# Patient Record
Sex: Female | Born: 1985 | Race: White | Hispanic: No | Smoking: Never smoker
Health system: Southern US, Community
[De-identification: ages and names within clinical notes are randomized; demographics above are authoritative.]

## PROBLEM LIST (undated history)

## (undated) DIAGNOSIS — N83209 Unspecified ovarian cyst, unspecified side: Secondary | ICD-10-CM

## (undated) DIAGNOSIS — N200 Calculus of kidney: Secondary | ICD-10-CM

---
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
## 2008-01-14 ENCOUNTER — Ambulatory Visit: Payer: Self-pay | Admitting: Internal Medicine

## 2009-11-30 ENCOUNTER — Emergency Department: Payer: Self-pay | Admitting: Internal Medicine

## 2010-07-13 ENCOUNTER — Emergency Department: Payer: Self-pay | Admitting: Emergency Medicine

## 2010-07-15 ENCOUNTER — Emergency Department (HOSPITAL_COMMUNITY): Payer: Self-pay

## 2010-07-15 ENCOUNTER — Emergency Department (HOSPITAL_COMMUNITY)
Admission: EM | Admit: 2010-07-15 | Discharge: 2010-07-15 | Disposition: A | Discharge status: Home / Self Care | Payer: Self-pay | Attending: Emergency Medicine | Admitting: Emergency Medicine

## 2010-07-15 DIAGNOSIS — N201 Calculus of ureter: Secondary | ICD-10-CM | POA: Insufficient documentation

## 2010-07-15 LAB — URINE MICROSCOPIC-ADD ON

## 2010-07-15 LAB — BASIC METABOLIC PANEL
BUN: 8 mg/dL (ref 6–23)
CO2: 25 mEq/L (ref 19–32)
Calcium: 8.5 mg/dL (ref 8.4–10.5)
Creatinine, Ser: 0.53 mg/dL (ref 0.4–1.2)
GFR calc Af Amer: >60 mL/min (ref >60)

## 2010-07-15 LAB — CBC
MCH: 31.2 pg (ref 26.0–34.0)
MCHC: 34.9 g/dL (ref 30.0–36.0)
Platelets: 164 10*3/uL (ref 150–400)

## 2010-07-15 LAB — URINALYSIS, ROUTINE W REFLEX MICROSCOPIC
Bilirubin Urine: NEGATIVE
Nitrite: NEGATIVE
Specific Gravity, Urine: 1.021 (ref 1.005–1.030)
pH: 6 (ref 5.0–8.0)

## 2010-07-15 LAB — DIFFERENTIAL
Basophils Relative: 0 % (ref 0–1)
Eosinophils Absolute: 0.1 10*3/uL (ref 0.0–0.7)
Eosinophils Relative: 1 % (ref 0–5)
Monocytes Absolute: 0.7 10*3/uL (ref 0.1–1.0)
Monocytes Relative: 10 % (ref 3–12)

## 2010-09-16 ENCOUNTER — Emergency Department: Payer: Self-pay | Admitting: Emergency Medicine

## 2011-02-28 ENCOUNTER — Emergency Department: Payer: Self-pay | Admitting: Emergency Medicine

## 2011-02-28 LAB — URINALYSIS, COMPLETE
Bacteria: NONE SEEN
Glucose,UR: NEGATIVE mg/dL (ref 0–75)
Nitrite: NEGATIVE
Protein: 30
RBC,UR: 59 /HPF (ref 0–5)
WBC UR: 4 /HPF (ref 0–5)

## 2011-02-28 LAB — CBC
MCHC: 33.6 g/dL (ref 32.0–36.0)
Platelet: 218 10*3/uL (ref 150–440)
RDW: 12.6 % (ref 11.5–14.5)
WBC: 13.1 10*3/uL — ABNORMAL HIGH (ref 3.6–11.0)

## 2011-02-28 LAB — BASIC METABOLIC PANEL
BUN: 12 mg/dL (ref 7–18)
EGFR (African American): >60
EGFR (Non-African Amer.): >60
Glucose: 111 mg/dL — ABNORMAL HIGH (ref 65–99)
Potassium: 3.9 mmol/L (ref 3.5–5.1)
Sodium: 144 mmol/L (ref 136–145)

## 2011-02-28 LAB — HCG, QUANTITATIVE, PREGNANCY: Beta Hcg, Quant.: <1 m[IU]/mL — ABNORMAL LOW

## 2012-03-22 ENCOUNTER — Emergency Department: Payer: Self-pay | Admitting: Internal Medicine

## 2012-05-14 ENCOUNTER — Emergency Department: Payer: Self-pay | Admitting: Unknown Physician Specialty

## 2012-05-14 LAB — HCG, QUANTITATIVE, PREGNANCY: Beta Hcg, Quant.: <1 m[IU]/mL — ABNORMAL LOW

## 2012-05-14 LAB — URINALYSIS, COMPLETE
Bacteria: NONE SEEN
Glucose,UR: NEGATIVE mg/dL (ref 0–75)
Nitrite: NEGATIVE
Ph: 5 (ref 4.5–8.0)
Protein: NEGATIVE
RBC,UR: 1 /HPF (ref 0–5)
Specific Gravity: 1.025 (ref 1.003–1.030)
Squamous Epithelial: 1
WBC UR: 2 /HPF (ref 0–5)

## 2012-05-14 LAB — COMPREHENSIVE METABOLIC PANEL
Albumin: 4.3 g/dL (ref 3.4–5.0)
Anion Gap: 6 — ABNORMAL LOW (ref 7–16)
BUN: 11 mg/dL (ref 7–18)
Bilirubin,Total: 0.4 mg/dL (ref 0.2–1.0)
Calcium, Total: 8.9 mg/dL (ref 8.5–10.1)
Co2: 28 mmol/L (ref 21–32)
EGFR (African American): >60
EGFR (Non-African Amer.): >60
Glucose: 88 mg/dL (ref 65–99)
SGOT(AST): 21 U/L (ref 15–37)
Sodium: 143 mmol/L (ref 136–145)

## 2012-05-14 LAB — CBC
MCHC: 34.7 g/dL (ref 32.0–36.0)
Platelet: 210 10*3/uL (ref 150–440)
RBC: 4.77 10*6/uL (ref 3.80–5.20)
RDW: 12.7 % (ref 11.5–14.5)
WBC: 6.2 10*3/uL (ref 3.6–11.0)

## 2012-09-16 ENCOUNTER — Emergency Department: Payer: Self-pay | Admitting: Emergency Medicine

## 2012-09-16 LAB — CBC
MCV: 91 fL (ref 80–100)
RBC: 4.14 10*6/uL (ref 3.80–5.20)

## 2015-09-02 ENCOUNTER — Emergency Department: Payer: BLUE CROSS/BLUE SHIELD

## 2015-09-02 ENCOUNTER — Encounter: Payer: Self-pay | Admitting: Emergency Medicine

## 2015-09-02 ENCOUNTER — Emergency Department
Admission: EM | Admit: 2015-09-02 | Discharge: 2015-09-02 | Disposition: A | Discharge status: Home / Self Care | Payer: BLUE CROSS/BLUE SHIELD | Attending: Emergency Medicine | Admitting: Emergency Medicine

## 2015-09-02 DIAGNOSIS — N200 Calculus of kidney: Secondary | ICD-10-CM | POA: Insufficient documentation

## 2015-09-02 DIAGNOSIS — Z79899 Other long term (current) drug therapy: Secondary | ICD-10-CM | POA: Diagnosis not present

## 2015-09-02 DIAGNOSIS — R102 Pelvic and perineal pain: Secondary | ICD-10-CM

## 2015-09-02 DIAGNOSIS — R109 Unspecified abdominal pain: Secondary | ICD-10-CM

## 2015-09-02 HISTORY — DX: Calculus of kidney: N20.0

## 2015-09-02 HISTORY — DX: Unspecified ovarian cyst, unspecified side: N83.209

## 2015-09-02 LAB — COMPREHENSIVE METABOLIC PANEL
ALBUMIN: 4.2 g/dL (ref 3.5–5.0)
ALK PHOS: 42 U/L (ref 38–126)
ALT: 22 U/L (ref 14–54)
ANION GAP: 5 (ref 5–15)
AST: 22 U/L (ref 15–41)
BUN: 10 mg/dL (ref 6–20)
CALCIUM: 8.8 mg/dL — AB (ref 8.9–10.3)
CHLORIDE: 108 mmol/L (ref 101–111)
CO2: 26 mmol/L (ref 22–32)
Creatinine, Ser: 0.67 mg/dL (ref 0.44–1.00)
GFR calc Af Amer: >60 mL/min (ref >60)
GFR calc non Af Amer: >60 mL/min (ref >60)
GLUCOSE: 111 mg/dL — AB (ref 65–99)
Potassium: 3.8 mmol/L (ref 3.5–5.1)
SODIUM: 139 mmol/L (ref 135–145)
Total Bilirubin: 0.4 mg/dL (ref 0.3–1.2)
Total Protein: 7.1 g/dL (ref 6.5–8.1)

## 2015-09-02 LAB — URINALYSIS COMPLETE WITH MICROSCOPIC (ARMC ONLY)
BACTERIA UA: NONE SEEN
Bilirubin Urine: NEGATIVE
GLUCOSE, UA: NEGATIVE mg/dL
Hgb urine dipstick: NEGATIVE
Ketones, ur: NEGATIVE mg/dL
Leukocytes, UA: NEGATIVE
Nitrite: NEGATIVE
Protein, ur: NEGATIVE mg/dL
SPECIFIC GRAVITY, URINE: 1.014 (ref 1.005–1.030)
pH: 7 (ref 5.0–8.0)

## 2015-09-02 LAB — CBC
HEMATOCRIT: 41.4 % (ref 35.0–47.0)
HEMOGLOBIN: 14.4 g/dL (ref 12.0–16.0)
MCH: 31.7 pg (ref 26.0–34.0)
MCHC: 34.8 g/dL (ref 32.0–36.0)
MCV: 90.9 fL (ref 80.0–100.0)
Platelets: 226 10*3/uL (ref 150–440)
RBC: 4.56 MIL/uL (ref 3.80–5.20)
RDW: 12.8 % (ref 11.5–14.5)
WBC: 7.5 10*3/uL (ref 3.6–11.0)

## 2015-09-02 LAB — POCT PREGNANCY, URINE: PREG TEST UR: NEGATIVE

## 2015-09-02 LAB — LIPASE, BLOOD: LIPASE: 27 U/L (ref 11–51)

## 2015-09-02 MED ORDER — KETOROLAC TROMETHAMINE 30 MG/ML IJ SOLN
30.0000 mg | Freq: Once | INTRAMUSCULAR | Status: AC
Start: 2015-09-02 — End: 2015-09-02
  Administered 2015-09-02: 30 mg via INTRAVENOUS
  Filled 2015-09-02: qty 1

## 2015-09-02 NOTE — ED Notes (Signed)
Pt arrived to the ED for complaints of LLQ sharp pain for about a month getting worse in the last 3 days with new onset of nausea. Pt reports that she has been spotting for the last 2 months and states that she had ovarian cyst on the past. Pt reports starting a new birth control recently. Pt is AOx4 in no apparent distress.

## 2015-09-02 NOTE — ED Provider Notes (Signed)
[facility] Emergency Department Provider Note  ____________________________________________  Time seen: 4:00 AM  I have reviewed the triage vital signs and the nursing notes.   HISTORY  Chief Complaint Abdominal Pain      HPI Judy Kim is a 30 y.o. female with history of ovarian cysts and multiple kidney stones presents with left pelvic pain 3 days currently 7 out of 10. Patient also admits to nausea and vomiting times once tonight. Patient denies any dysuria no hematuria or urinary frequency or urgency. Patient denies any diarrhea or constipation.     Past Medical History  Diagnosis Date  . Ovarian cyst   . Kidney stones     There are no active problems to display for this patient.   Past surgical history None  Current Outpatient Rx  Name  Route  Sig  Dispense  Refill  . etonogestrel-ethinyl estradiol (NUVARING) 0.12-0.015 MG/24HR vaginal ring   Vaginal   Place 1 each vaginally every 28 (twenty-eight) days. Insert vaginally and leave in place for 3 consecutive weeks, then remove for 1 week.           Allergies Keflex  History reviewed. No pertinent family history.  Social History Social History  Substance Use Topics  . Smoking status: Never Smoker   . Smokeless tobacco: None  . Alcohol Use: Yes    Review of Systems  Constitutional: Negative for fever. Eyes: Negative for visual changes. ENT: Negative for sore throat. Cardiovascular: Negative for chest pain. Respiratory: Negative for shortness of breath. Gastrointestinal: Negative for abdominal pain, vomiting and diarrhea. Genitourinary: Negative for dysuria.Positive for pelvic pain Musculoskeletal: Negative for back pain. Skin: Negative for rash. Neurological: Negative for headaches, focal weakness or numbness.   10-point ROS otherwise negative.  ____________________________________________   PHYSICAL EXAM:  VITAL SIGNS: ED Triage Vitals  Enc Vitals Group    BP 09/02/15 0339 128/78 mmHg     Pulse Rate 09/02/15 0300 92     Resp 09/02/15 0300 18     Temp 09/02/15 0300 98.4 F (36.9 C)     Temp Source 09/02/15 0300 Oral     SpO2 09/02/15 0300 100 %     Weight 09/02/15 0300 140 lb (63.504 kg)     Height 09/02/15 0300 4\' 11"  (1.499 m)     Head Cir --      Peak Flow --      Pain Score 09/02/15 0301 8     Pain Loc --      Pain Edu? --      Excl. in GC? --     Constitutional: Alert and oriented. Apparent discomfort. Eyes: Conjunctivae are normal. PERRL. Normal extraocular movements. ENT   Head: Normocephalic and atraumatic.   Nose: No congestion/rhinnorhea.   Mouth/Throat: Mucous membranes are moist.   Neck: No stridor. Hematological/Lymphatic/Immunilogical: No cervical lymphadenopathy. Cardiovascular: Normal rate, regular rhythm. Normal and symmetric distal pulses are present in all extremities. No murmurs, rubs, or gallops. Respiratory: Normal respiratory effort without tachypnea nor retractions. Breath sounds are clear and equal bilaterally. No wheezes/rales/rhonchi. Gastrointestinal: Soft and nontender. No distention. There is no CVA tenderness. Genitourinary: deferred Musculoskeletal: Nontender with normal range of motion in all extremities. No joint effusions.  No lower extremity tenderness nor edema. Neurologic:  Normal speech and language. No gross focal neurologic deficits are appreciated. Speech is normal.  Skin:  Skin is warm, dry and intact. No rash noted. Psychiatric: Mood and affect are normal. Speech and behavior are normal. Patient exhibits  appropriate insight and judgment.  ____________________________________________    LABS (pertinent positives/negatives)  Labs Reviewed  COMPREHENSIVE METABOLIC PANEL - Abnormal; Notable for the following:    Glucose, Bld 111 (*)    Calcium 8.8 (*)    All other components within normal limits  URINALYSIS COMPLETEWITH MICROSCOPIC ([facility] ONLY) - Abnormal; Notable for the  following:    Color, Urine YELLOW (*)    APPearance CLEAR (*)    Squamous Epithelial / LPF 0-5 (*)    All other components within normal limits  LIPASE, BLOOD  CBC  POCT PREGNANCY, URINE         RADIOLOGY  CT Renal Stone Study (Final result) Result time: 09/02/15 06:37:09   Final result by Rad Results In Interface (09/02/15 06:37:09)   Narrative:   CLINICAL DATA: Left pelvic pain  EXAM: CT ABDOMEN AND PELVIS WITHOUT CONTRAST  TECHNIQUE: Multidetector CT imaging of the abdomen and pelvis was performed following the standard protocol without IV contrast.  COMPARISON: 02/28/2011  FINDINGS: Lower chest and abdominal wall: No contributory findings.  Hepatobiliary: No focal liver abnormality.No evidence of biliary obstruction or stone.  Pancreas: Unremarkable.  Spleen: Unremarkable.  Adrenals/Urinary Tract: Negative adrenals. Two presumed cysts in the interpolar left kidney both measuring 14 mm. 4 mm lower pole stone on the right. Punctate stone on the left. No hydronephrosis or ureteral calculus. Unremarkable bladder.  Stomach/Bowel: No obstruction or inflammation. No appendicitis.  Reproductive:No pathologic findings. Vaginal ring in place.  Vascular/Lymphatic: No acute vascular abnormality. No mass or adenopathy.  Other: Trace pelvic fluid, usually physiologic.  Musculoskeletal: No acute abnormalities.  IMPRESSION: 1. No acute finding to explain abdominal pain. 2. Bilateral nonobstructive renal calculi.   Electronically Signed By: Marnee Spring M.D. On: 09/02/2015 06:37          US Pelvis Complete (Final result) Result time: 09/02/15 05:00:48   Final result by Rad Results In Interface (09/02/15 05:00:48)   Narrative:   CLINICAL DATA: Pelvic pain for 3 days  EXAM: TRANSABDOMINAL AND TRANSVAGINAL ULTRASOUND OF PELVIS  TECHNIQUE: Both transabdominal and transvaginal ultrasound examinations of the pelvis were performed.  Transabdominal technique was performed for global imaging of the pelvis including uterus, ovaries, adnexal regions, and pelvic cul-de-sac. It was necessary to proceed with endovaginal exam following the transabdominal exam to visualize the endometrium and ovaries.  COMPARISON: 05/14/2012  FINDINGS: Uterus  Measurements: 6 x 3 x 5 cm. No fibroids or other mass visualized.  Endometrium  Thickness: 9 mm. No focal abnormality visualized.  Right ovary  Measurements: 28 x 21 x 29 mm. Normal appearance/no adnexal mass.  Left ovary  Measurements: 23 x 18 x 29 mm. Normal appearance/no adnexal mass.  Other findings  No abnormal free fluid.  IMPRESSION: Normal pelvic ultrasound.   Electronically Signed By: Marnee Spring M.D. On: 09/02/2015 05:00          US Transvaginal Non-OB (Final result) Result time: 09/02/15 05:00:48   Final result by Rad Results In Interface (09/02/15 05:00:48)   Narrative:   CLINICAL DATA: Pelvic pain for 3 days  EXAM: TRANSABDOMINAL AND TRANSVAGINAL ULTRASOUND OF PELVIS  TECHNIQUE: Both transabdominal and transvaginal ultrasound examinations of the pelvis were performed. Transabdominal technique was performed for global imaging of the pelvis including uterus, ovaries, adnexal regions, and pelvic cul-de-sac. It was necessary to proceed with endovaginal exam following the transabdominal exam to visualize the endometrium and ovaries.  COMPARISON: 05/14/2012  FINDINGS: Uterus  Measurements: 6 x 3 x 5 cm. No fibroids or other mass  visualized.  Endometrium  Thickness: 9 mm. No focal abnormality visualized.  Right ovary  Measurements: 28 x 21 x 29 mm. Normal appearance/no adnexal mass.  Left ovary  Measurements: 23 x 18 x 29 mm. Normal appearance/no adnexal mass.  Other findings  No abnormal free fluid.  IMPRESSION: Normal pelvic ultrasound.   Electronically Signed By: Marnee SpringJonathon Watts M.D. On: 09/02/2015  05:00      Procedures     INITIAL IMPRESSION / ASSESSMENT AND PLAN / ED COURSE  Pertinent labs & imaging results that were available during my care of the patient were reviewed by me and considered in my medical decision making (see chart for details).  Patient given Toradol 30 mg IV  ____________________________________________   FINAL CLINICAL IMPRESSION(S) / ED DIAGNOSES  Final diagnoses:  Abdominal pain, unspecified abdominal location  Kidney stones      Darci Currentandolph N Airelle Everding, MD 09/03/15 [phone]

## 2015-09-02 NOTE — ED Notes (Signed)
Patient transported to Ultrasound 

## 2015-09-02 NOTE — Discharge Instructions (Signed)
Abdominal Pain, Adult °Many things can cause abdominal pain. Usually, abdominal pain is not caused by a disease and will improve without treatment. It can often be observed and treated at home. Your health care provider will do a physical exam and possibly order blood tests and X-rays to help determine the seriousness of your pain. However, in many cases, more time must pass before a clear cause of the pain can be found. Before that point, your health care provider may not know if you need more testing or further treatment. °HOME CARE INSTRUCTIONS °Monitor your abdominal pain for any changes. The following actions may help to alleviate any discomfort you are experiencing: °· Only take over-the-counter or prescription medicines as directed by your health care provider. °· Do not take laxatives unless directed to do so by your health care provider. °· Try a clear liquid diet (broth, tea, or water) as directed by your health care provider. Slowly move to a bland diet as tolerated. °SEEK MEDICAL CARE IF: °· You have unexplained abdominal pain. °· You have abdominal pain associated with nausea or diarrhea. °· You have pain when you urinate or have a bowel movement. °· You experience abdominal pain that wakes you in the night. °· You have abdominal pain that is worsened or improved by eating food. °· You have abdominal pain that is worsened with eating fatty foods. °· You have a fever. °SEEK IMMEDIATE MEDICAL CARE IF: °· Your pain does not go away within 2 hours. °· You keep throwing up (vomiting). °· Your pain is felt only in portions of the abdomen, such as the right side or the left lower portion of the abdomen. °· You pass bloody or black tarry stools. °MAKE SURE YOU: °· Understand these instructions. °· Will watch your condition. °· Will get help right away if you are not doing well or get worse. °  °This information is not intended to replace advice given to you by your health care provider. Make sure you discuss  any questions you have with your health care provider. °  °Document Released: 11/10/2004 Document Revised: 10/22/2014 Document Reviewed: 10/10/2012 °Elsevier Interactive Patient Education ©2016 [employer] ° °Kidney Stones °Kidney stones (urolithiasis) are deposits that form inside your kidneys. The intense pain is caused by the stone moving through the urinary tract. When the stone moves, the ureter goes into spasm around the stone. The stone is usually passed in the urine.  °CAUSES  °· A disorder that makes certain neck glands produce too much parathyroid hormone (primary hyperparathyroidism). °· A buildup of uric acid crystals, similar to gout in your joints. °· Narrowing (stricture) of the ureter. °· A kidney obstruction present at birth (congenital obstruction). °· Previous surgery on the kidney or ureters. °· Numerous kidney infections. °SYMPTOMS  °· Feeling sick to your stomach (nauseous). °· Throwing up (vomiting). °· Blood in the urine (hematuria). °· Pain that usually spreads (radiates) to the groin. °· Frequency or urgency of urination. °DIAGNOSIS  °· Taking a history and physical exam. °· Blood or urine tests. °· CT scan. °· Occasionally, an examination of the inside of the urinary bladder (cystoscopy) is performed. °TREATMENT  °· Observation. °· Increasing your fluid intake. °· Extracorporeal shock wave lithotripsy--This is a noninvasive procedure that uses shock waves to break up kidney stones. °· Surgery may be needed if you have severe pain or persistent obstruction. There are various surgical procedures. Most of the procedures are performed with the use of small instruments. Only small incisions   are needed to accommodate these instruments, so recovery time is minimized. °The size, location, and chemical composition are all important variables that will determine the proper choice of action for you. Talk to your health care provider to better understand your situation so that you will minimize the  risk of injury to yourself and your kidney.  °HOME CARE INSTRUCTIONS  °· Drink enough water and fluids to keep your urine clear or pale yellow. This will help you to pass the stone or stone fragments. °· Strain all urine through the provided strainer. Keep all particulate matter and stones for your health care provider to see. The stone causing the pain may be as small as a grain of salt. It is very important to use the strainer each and every time you pass your urine. The collection of your stone will allow your health care provider to analyze it and verify that a stone has actually passed. The stone analysis will often identify what you can do to reduce the incidence of recurrences. °· Only take over-the-counter or prescription medicines for pain, discomfort, or fever as directed by your health care provider. °· Keep all follow-up visits as told by your health care provider. This is important. °· Get follow-up X-rays if required. The absence of pain does not always mean that the stone has passed. It may have only stopped moving. If the urine remains completely obstructed, it can cause loss of kidney function or even complete destruction of the kidney. It is your responsibility to make sure X-rays and follow-ups are completed. Ultrasounds of the kidney can show blockages and the status of the kidney. Ultrasounds are not associated with any radiation and can be performed easily in a matter of minutes. °· Make changes to your daily diet as told by your health care provider. You may be told to: °¨ Limit the amount of salt that you eat. °¨ Eat 5 or more servings of fruits and vegetables each day. °¨ Limit the amount of meat, poultry, fish, and eggs that you eat. °· Collect a 24-hour urine sample as told by your health care provider. You may need to collect another urine sample every 6-12 months. °SEEK MEDICAL CARE IF: °· You experience pain that is progressive and unresponsive to any pain medicine you have been  prescribed. °SEEK IMMEDIATE MEDICAL CARE IF:  °· Pain cannot be controlled with the prescribed medicine. °· You have a fever or shaking chills. °· The severity or intensity of pain increases over 18 hours and is not relieved by pain medicine. °· You develop a new onset of abdominal pain. °· You feel faint or pass out. °· You are unable to urinate. °  °This information is not intended to replace advice given to you by your health care provider. Make sure you discuss any questions you have with your health care provider. °  °Document Released: 01/31/2005 Document Revised: 10/22/2014 Document Reviewed: 07/04/2012 °Elsevier Interactive Patient Education ©2016 Elsevier Inc. ° °

## 2015-09-02 NOTE — ED Notes (Addendum)
Pt reports that she is having sharp pains in left lower side for the past three days - Today the pain is constant so she came to the er - Pt states she has only voided once today but is not having pain - Pt reports she has been vomiting today ( x7 - 8 times today) - Pt denies diarrhea

## 2015-10-23 ENCOUNTER — Ambulatory Visit
Admission: EM | Admit: 2015-10-23 | Discharge: 2015-10-23 | Disposition: A | Discharge status: Home / Self Care | Payer: BLUE CROSS/BLUE SHIELD | Attending: Family Medicine | Admitting: Family Medicine

## 2015-10-23 DIAGNOSIS — J029 Acute pharyngitis, unspecified: Secondary | ICD-10-CM | POA: Diagnosis not present

## 2015-10-23 DIAGNOSIS — J069 Acute upper respiratory infection, unspecified: Secondary | ICD-10-CM | POA: Diagnosis not present

## 2015-10-23 LAB — RAPID STREP SCREEN (MED CTR MEBANE ONLY): Streptococcus, Group A Screen (Direct): NEGATIVE

## 2015-10-23 MED ORDER — HYDROCOD POLST-CPM POLST ER 10-8 MG/5ML PO SUER: 5.0000 mL | Freq: Two times a day (BID) | ORAL | 0 refills | Status: AC

## 2015-10-23 MED ORDER — BENZONATATE 200 MG PO CAPS: 200.0000 mg | ORAL_CAPSULE | Freq: Three times a day (TID) | ORAL | 0 refills | Status: AC

## 2015-10-23 NOTE — ED Provider Notes (Signed)
CSN: [id number]     Arrival date & time 10/23/15  1304 History   First MD Initiated Contact with Patient 10/23/15 1433     Chief Complaint  Patient presents with  . Sore Throat   (Consider location/radiation/quality/duration/timing/severity/associated sxs/prior Treatment) HPI  This a 30 year old female who presents with a three-day history of sore throat particularly when awakening chills fever to 100 by aches and a nagging cough that is particularly worse at nighttime. Taking over-the-counter preparations that have been helpful for short periods of time. The cough is nonproductive. Is a nonsmoker. Patient states that she needs a note for work in order to return all her she states she has not missed any work at all. She is afebrile today.     Past Medical History:  Diagnosis Date  . Kidney stones   . Ovarian cyst    History reviewed. No pertinent surgical history. History reviewed. No pertinent family history. Social History  Substance Use Topics  . Smoking status: Never Smoker  . Smokeless tobacco: Never Used  . Alcohol use Yes     Comment: social   OB History    Gravida Para Term Preterm AB Living   3 0 0 0 3 0   SAB TAB Ectopic Multiple Live Births   3 0 0 0       Review of Systems  Constitutional: Positive for chills and fever. Negative for activity change and fatigue.  HENT: Positive for congestion, postnasal drip, rhinorrhea, sinus pressure and sore throat.   Respiratory: Positive for cough.   All other systems reviewed and are negative.   Allergies  Keflex [cephalexin]  Home Medications   Prior to Admission medications   Medication Sig Start Date End Date Taking? Authorizing Provider  etonogestrel-ethinyl estradiol (NUVARING) 0.12-0.015 MG/24HR vaginal ring Place 1 each vaginally every 28 (twenty-eight) days. Insert vaginally and leave in place for 3 consecutive weeks, then remove for 1 week.   Yes Historical Provider, MD  benzonatate (TESSALON) 200 MG capsule  Take 1 capsule (200 mg total) by mouth every 8 (eight) hours. 10/23/15   Lutricia FeilWilliam P Aleksi Brummet, PA-C  chlorpheniramine-HYDROcodone (TUSSIONEX PENNKINETIC ER) 10-8 MG/5ML SUER Take 5 mLs by mouth 2 (two) times daily. 10/23/15   Lutricia FeilWilliam P Kemyra August, PA-C   Meds Ordered and Administered this Visit  Medications - No data to display  BP 127/73 (BP Location: Right Arm)   Pulse 76   Temp 98.2 F (36.8 C) (Oral)   Resp 16   Ht 4\' 11"  (1.499 m)   Wt 134 lb (60.8 kg)   LMP 10/07/2015   SpO2 100%   BMI 27.06 kg/m  No data found.   Physical Exam  Constitutional: She is oriented to person, place, and time. She appears well-developed and well-nourished. No distress.  HENT:  Head: Normocephalic and atraumatic.  Right Ear: External ear normal.  Left Ear: External ear normal.  Nose: Nose normal.  Mouth/Throat: Oropharynx is clear and moist. No oropharyngeal exudate.  Eyes: EOM are normal. Pupils are equal, round, and reactive to light. Right eye exhibits no discharge. Left eye exhibits no discharge.  Neck: Normal range of motion. Neck supple.  Pulmonary/Chest: Effort normal and breath sounds normal. No respiratory distress. She has no wheezes. She has no rales.  Musculoskeletal: Normal range of motion.  Lymphadenopathy:    She has no cervical adenopathy.  Neurological: She is alert and oriented to person, place, and time.  Skin: Skin is warm and dry. She is not diaphoretic.  Psychiatric: She has a normal mood and affect. Her behavior is normal. Judgment and thought content normal.  Nursing note and vitals reviewed.   Urgent Care Course   Clinical Course    Procedures (including critical care time)  Labs Review Labs Reviewed  RAPID STREP SCREEN (NOT AT [facility])  CULTURE, GROUP A STREP [facility])    Imaging Review No results found.   Visual Acuity Review  Right Eye Distance:   Left Eye Distance:   Bilateral Distance:    Right Eye Near:   Left Eye Near:    Bilateral Near:         MDM    1. Viral pharyngitis   2. URI (upper respiratory infection)    New Prescriptions   BENZONATATE (TESSALON) 200 MG CAPSULE    Take 1 capsule (200 mg total) by mouth every 8 (eight) hours.   CHLORPHENIRAMINE-HYDROCODONE (TUSSIONEX PENNKINETIC ER) 10-8 MG/5ML SUER    Take 5 mLs by mouth 2 (two) times daily.  Plan: 1. Test/x-ray results and diagnosis reviewed with patient 2. rx as per orders; risks, benefits, potential side effects reviewed with patient 3. Recommend supportive treatment with Fluids and rest as necessary. Salt water gargles may be comforting a beneficial. A note was written to have her return to work on Sunday. 4. F/u prn if symptoms worsen or don't improve     Lutricia Feil, PA-C 10/23/15 1502

## 2015-10-23 NOTE — ED Triage Notes (Signed)
Pt needs work note for her illness. Pt with 2 day hx of low grade fever, sore throat, achy. Pain 8/10

## 2015-10-26 LAB — CULTURE, GROUP A STREP (THRC)

## 2015-10-27 ENCOUNTER — Telehealth (HOSPITAL_COMMUNITY): Payer: Self-pay | Admitting: Internal Medicine

## 2015-10-27 MED ORDER — AZITHROMYCIN 250 MG PO TABS: 250.0000 mg | ORAL_TABLET | Freq: Every day | ORAL | 0 refills | Status: AC

## 2015-10-27 NOTE — Telephone Encounter (Signed)
Clinical staff, please let patient know that throat cx was positive for an atypical strep.  Rx zithromax xent to the pharmacy of record,  [facility].  Recheck or followup with PCP/[facility] for further evaluation if symptoms persist.  LM

## 2016-11-28 IMAGING — US US PELVIS COMPLETE
1 series · 14 of 25 positions shown · non-contrast
Comparison: 05/14/2012

CLINICAL DATA: Pelvic pain for 3 days

EXAM:
TRANSABDOMINAL AND TRANSVAGINAL ULTRASOUND OF PELVIS
TECHNIQUE: Both transabdominal and transvaginal ultrasound examinations of the
pelvis were performed. Transabdominal technique was performed for
global imaging of the pelvis including uterus, ovaries, adnexal
regions, and pelvic cul-de-sac. It was necessary to proceed with
endovaginal exam following the transabdominal exam to visualize the
endometrium and ovaries.

[Series 1: us pelvis complete · 0.23mm/px · 14 of 89 slices shown]
[im 1/89]
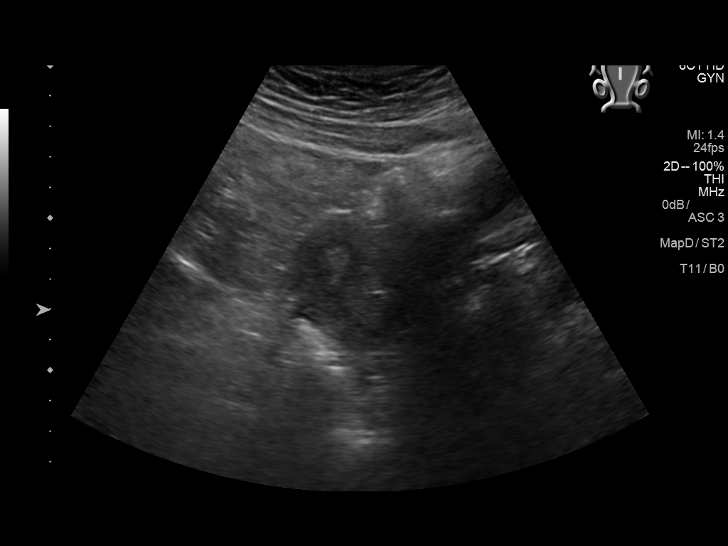
[im 8/89]
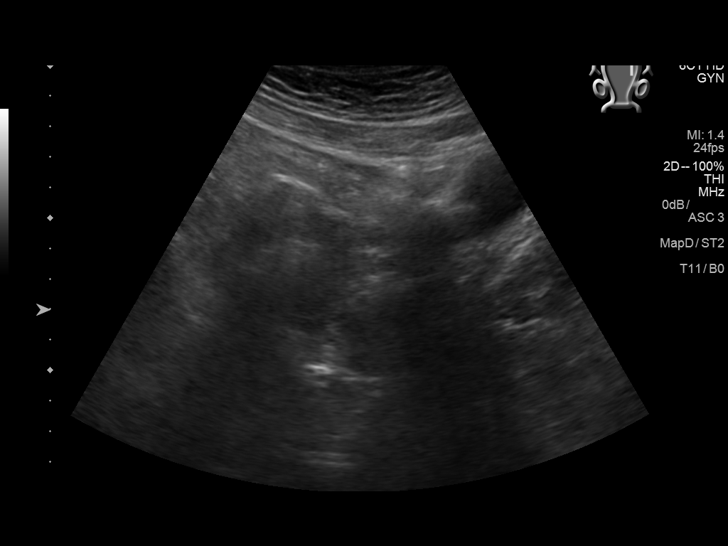
[im 15/89]
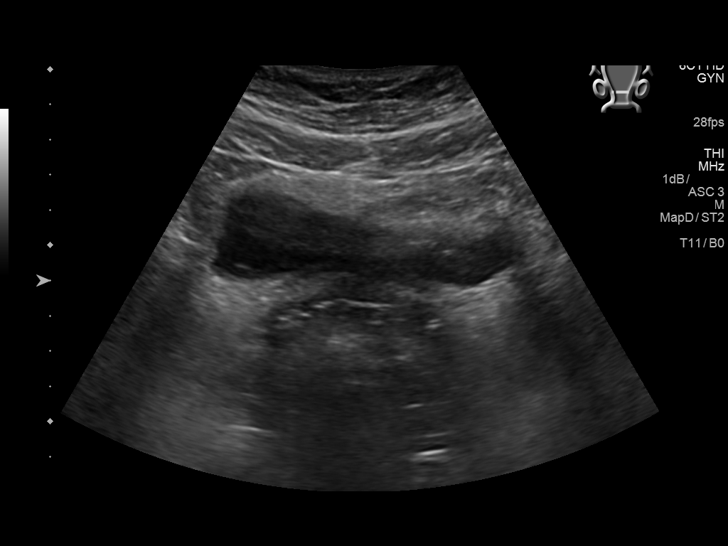
[im 23/89]
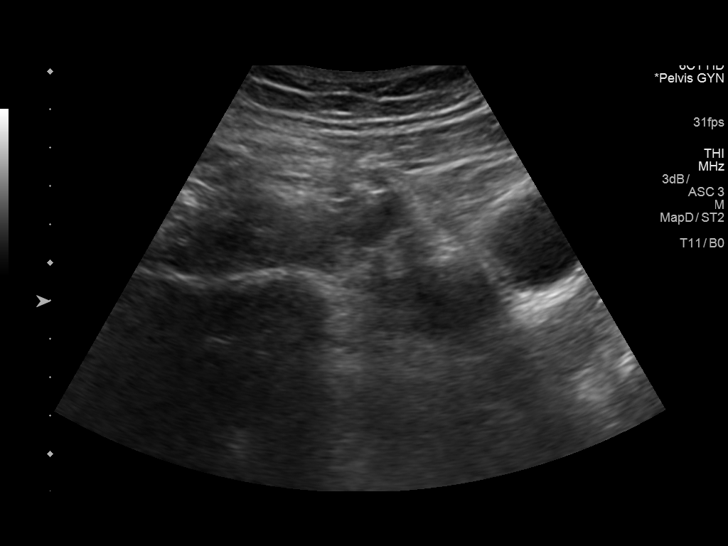
[im 30/89]
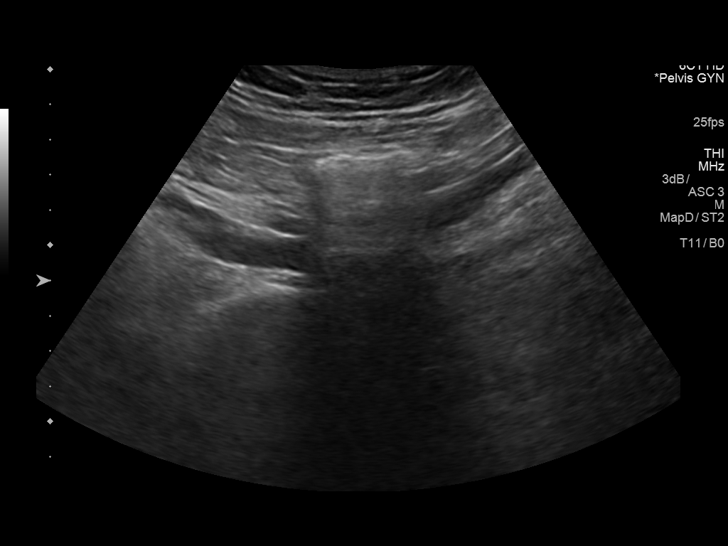
[im 34/89]
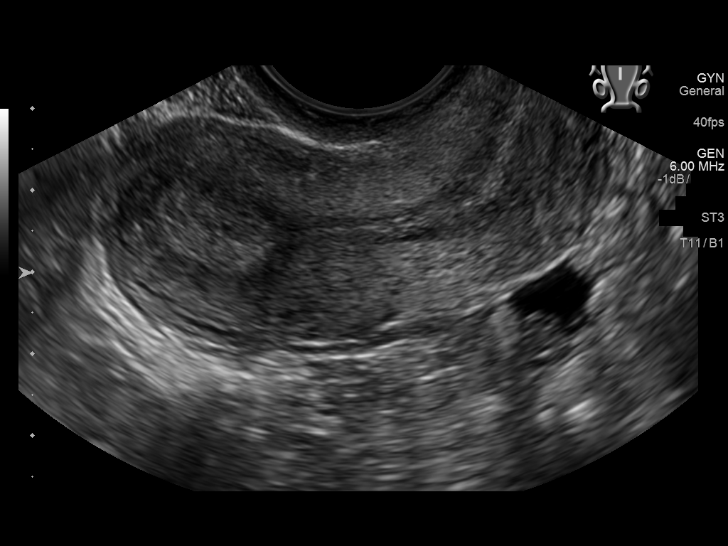
[im 41/89]
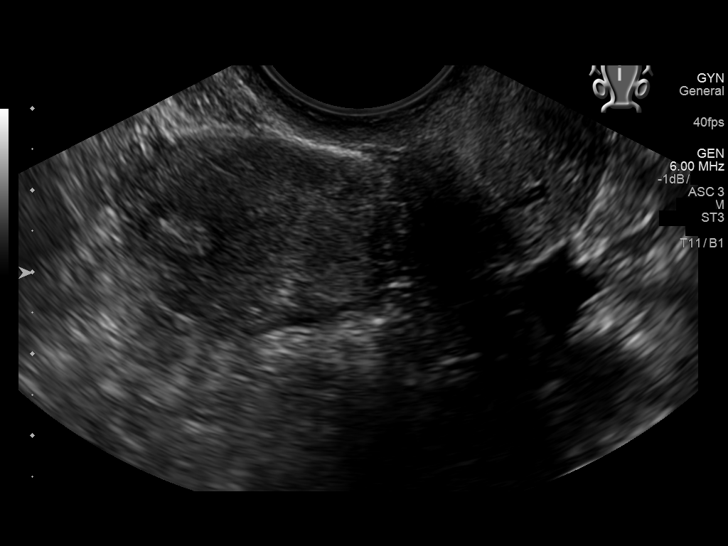
[im 48/89]
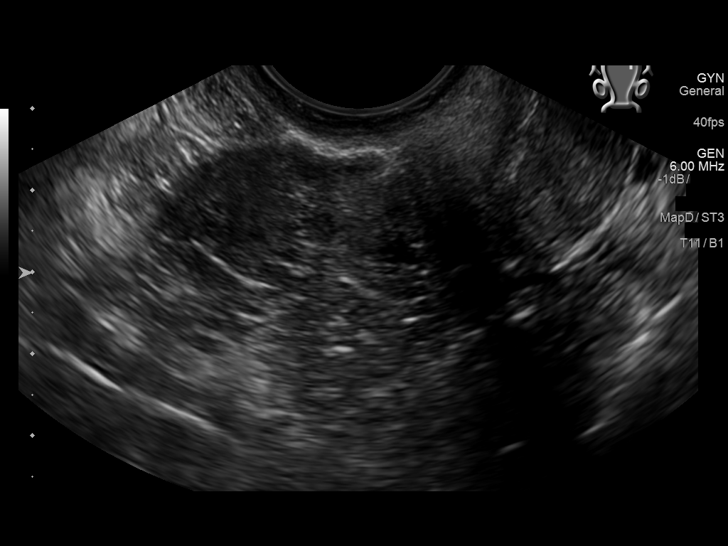
[im 56/89]
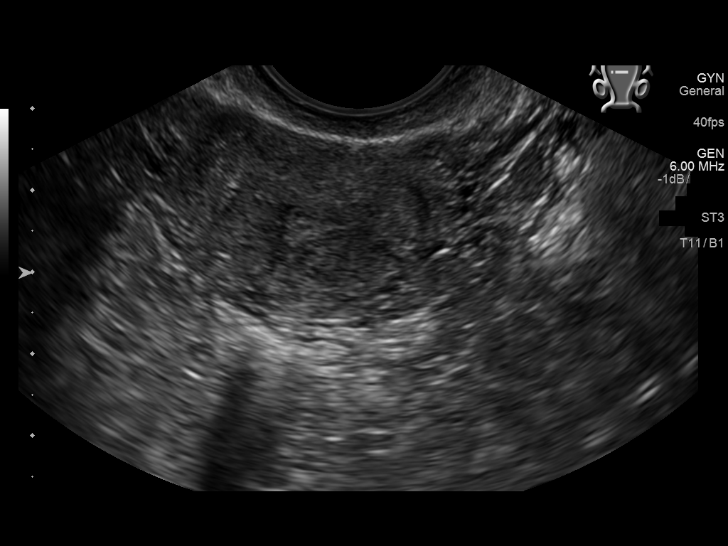
[im 59/89]
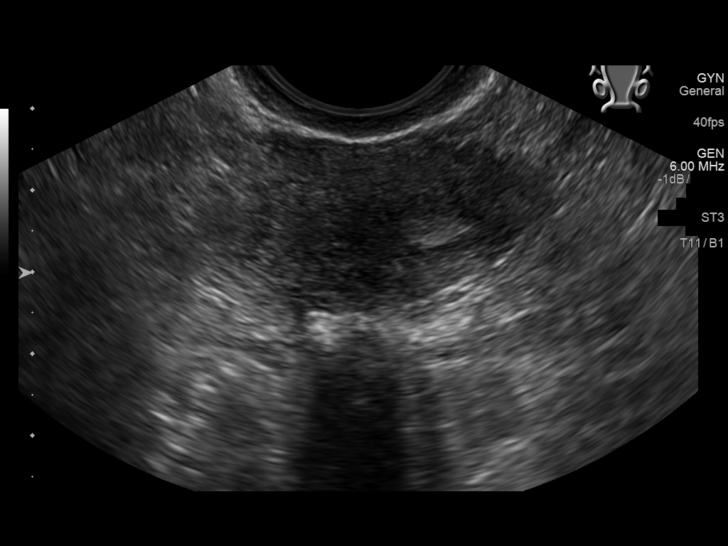
[im 67/89]
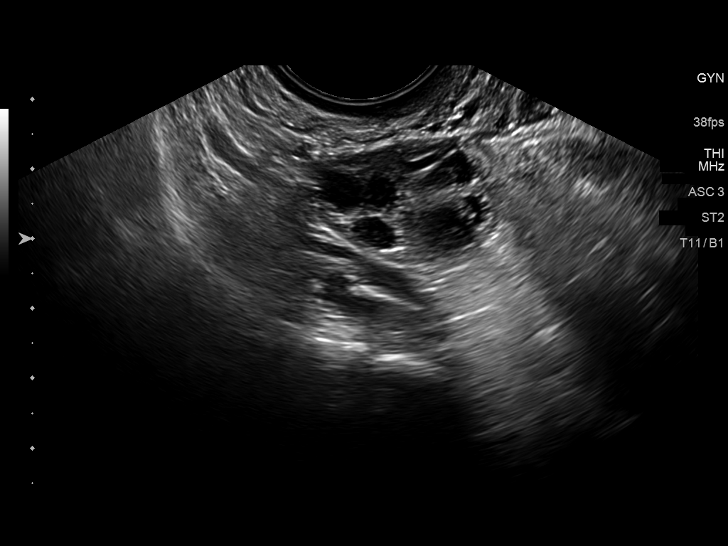
[im 74/89]
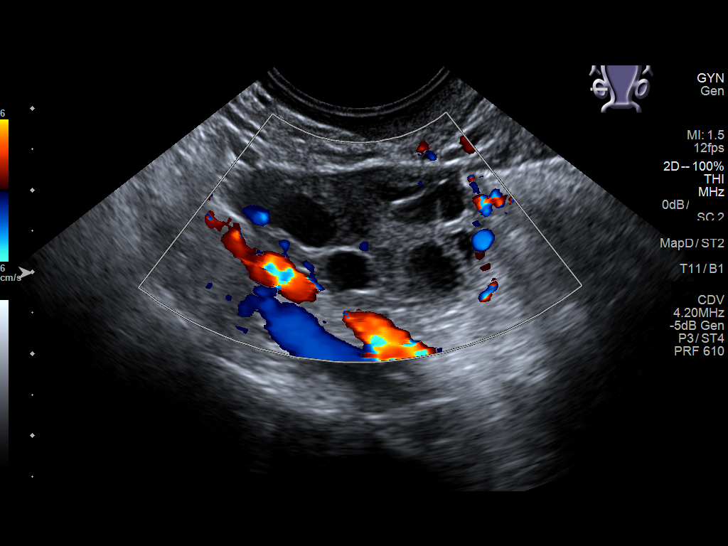
[im 81/89]
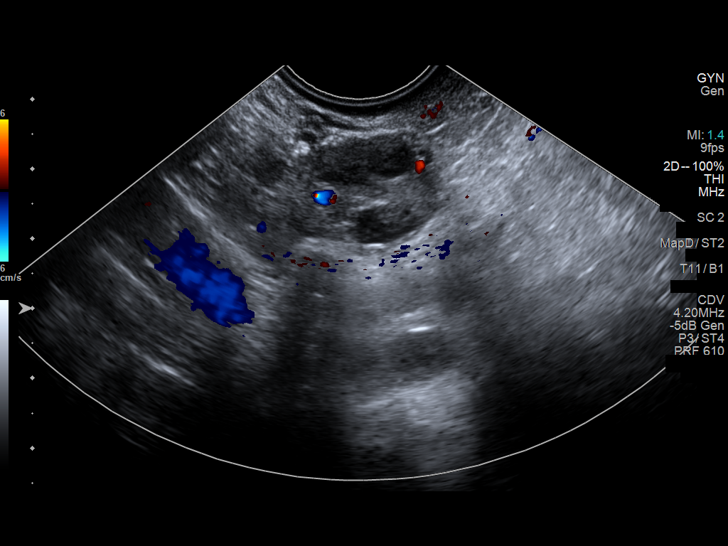
[im 89/89]
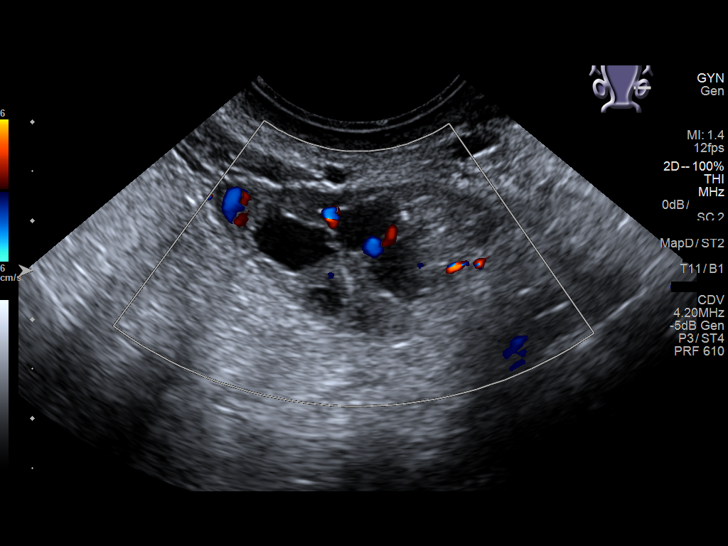

[14 of 25 positions shown; findings below may reference images not displayed]

FINDINGS: Uterus

Measurements: 6 x 3 x 5 cm. No fibroids or other mass visualized.

Endometrium

Thickness: 9 mm.  No focal abnormality visualized.

Right ovary

Measurements: 28 x 21 x 29 mm. Normal appearance/no adnexal mass.

Left ovary

Measurements: 23 x 18 x 29 mm. Normal appearance/no adnexal mass.

Other findings

No abnormal free fluid.
IMPRESSION: Normal pelvic ultrasound.

## 2018-05-29 IMAGING — CT CT RENAL STONE PROTOCOL
3 of 4 series · 10 of 46 positions shown, 15 images · non-contrast
Comparison: 02/28/2011

CLINICAL DATA: Left pelvic pain

EXAM:
CT ABDOMEN AND PELVIS WITHOUT CONTRAST
TECHNIQUE: Multidetector CT imaging of the abdomen and pelvis was performed
following the standard protocol without IV contrast.

[Series 4: lung · axial · 0.72mm/px · z∈[-898,-784]mm · 6 of 33 slices shown, 11 images]
[im 5/33  soft-tissue]
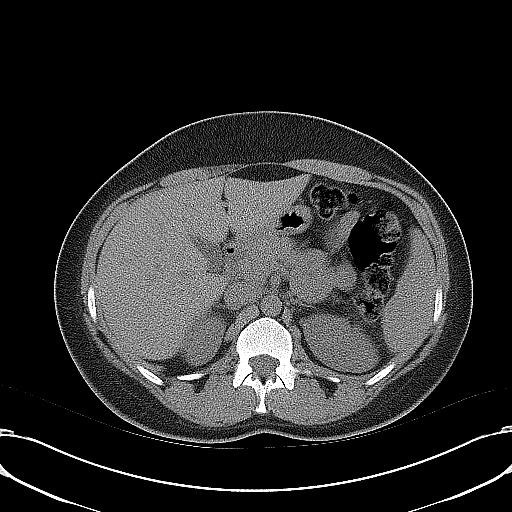
[im 5/33  bone]
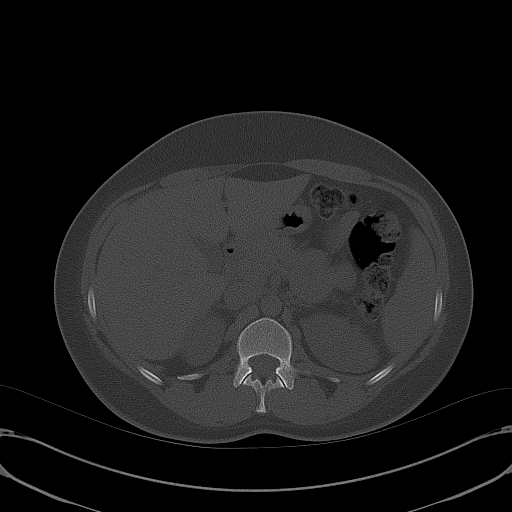
[im 10/33  soft-tissue]
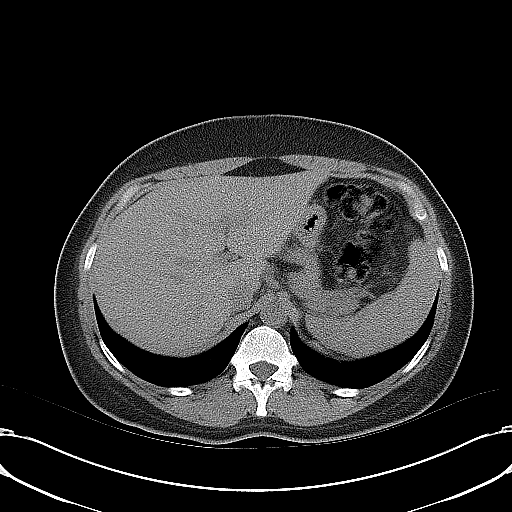
[im 14/33  soft-tissue]
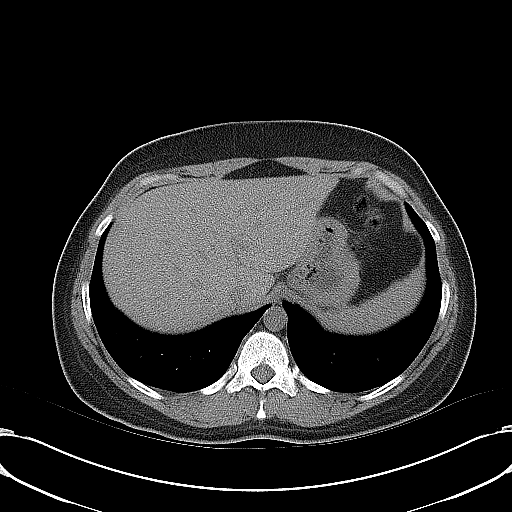
[im 14/33  lung]
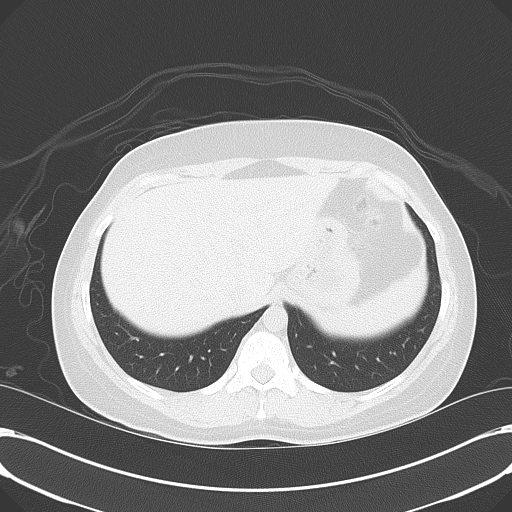
[im 19/33  soft-tissue]
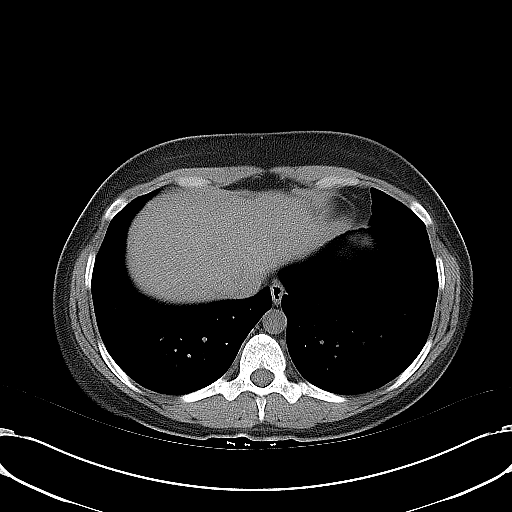
[im 19/33  lung]
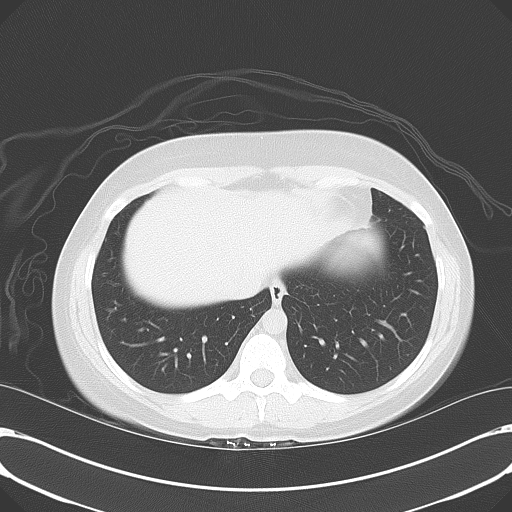
[im 23/33  soft-tissue]
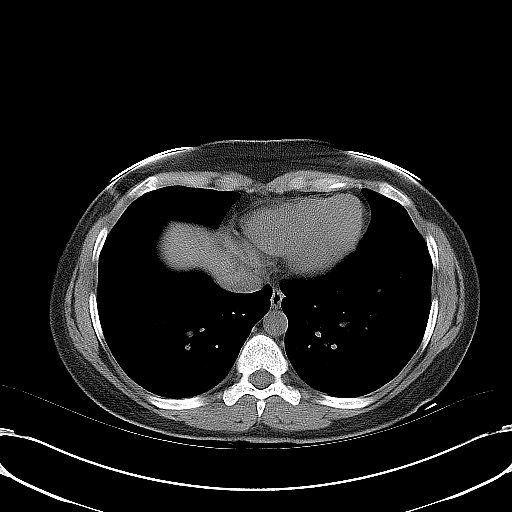
[im 23/33  lung]
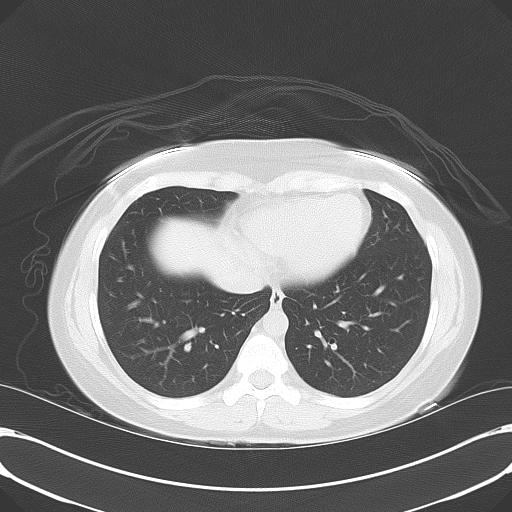
[im 28/33  soft-tissue]
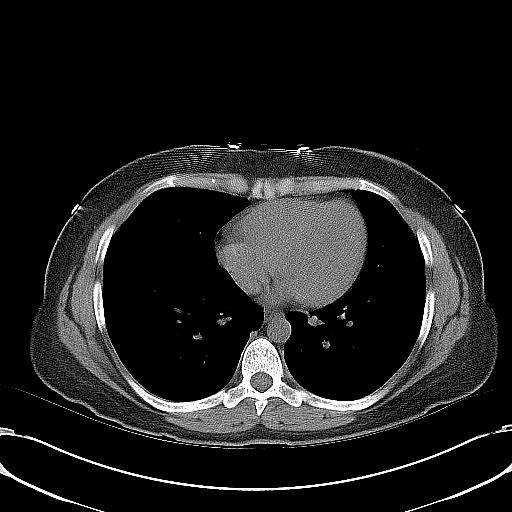
[im 28/33  lung]
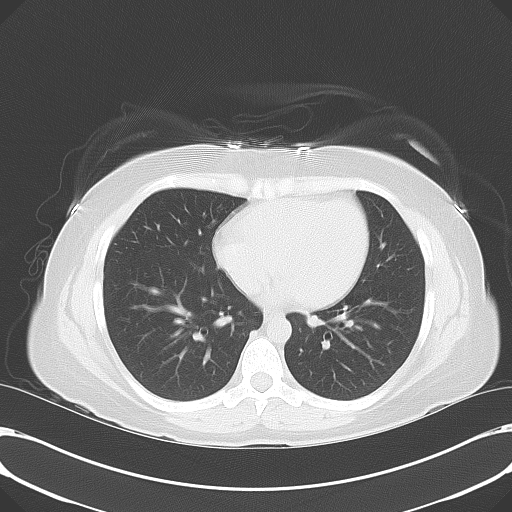

[Series 5: coronal · coronal · 0.71mm/px · 3 of 119 slices shown]
[im 40/119  soft-tissue]
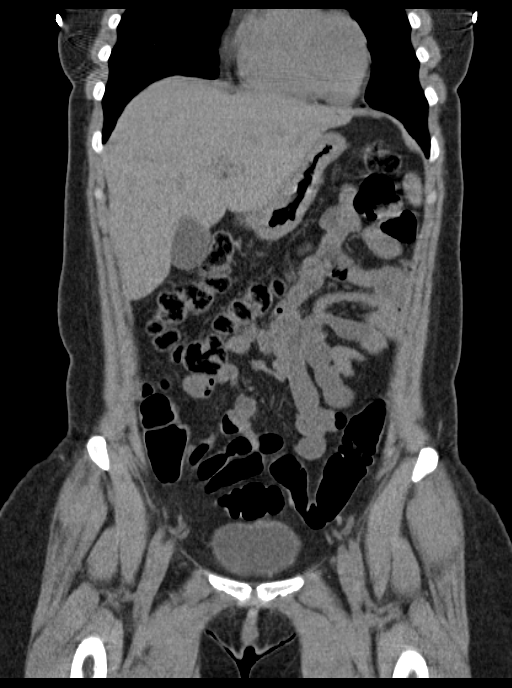
[im 53/119  soft-tissue]
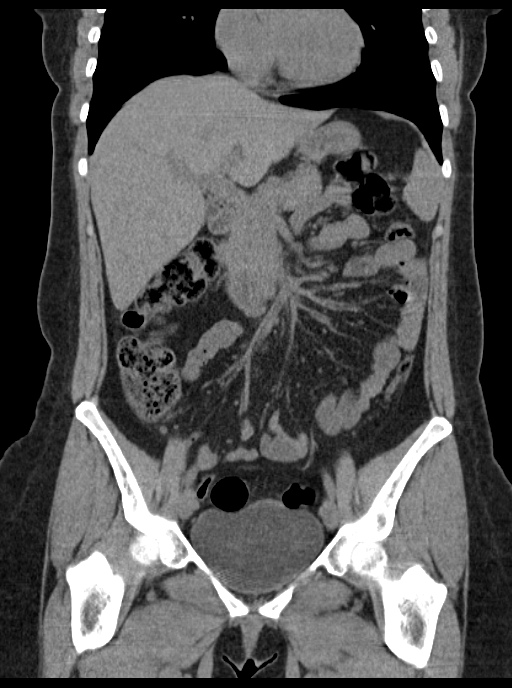
[im 66/119  soft-tissue]
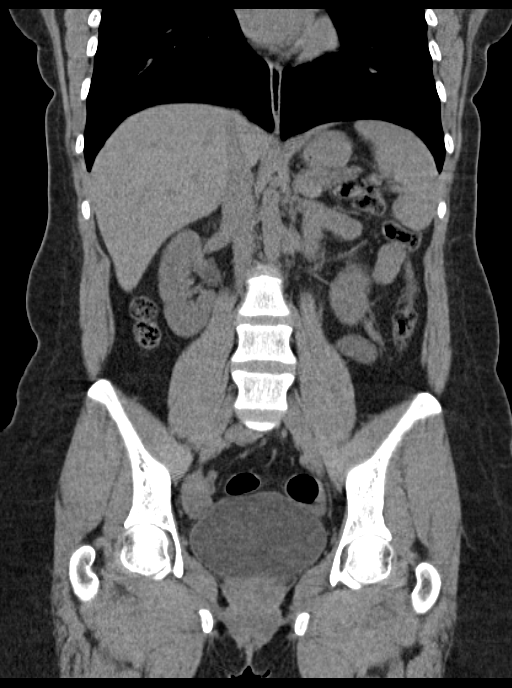

[Series 6: sagittal · sagittal · 0.51mm/px · 1 of 180 slices shown]
[im 60/180  soft-tissue]
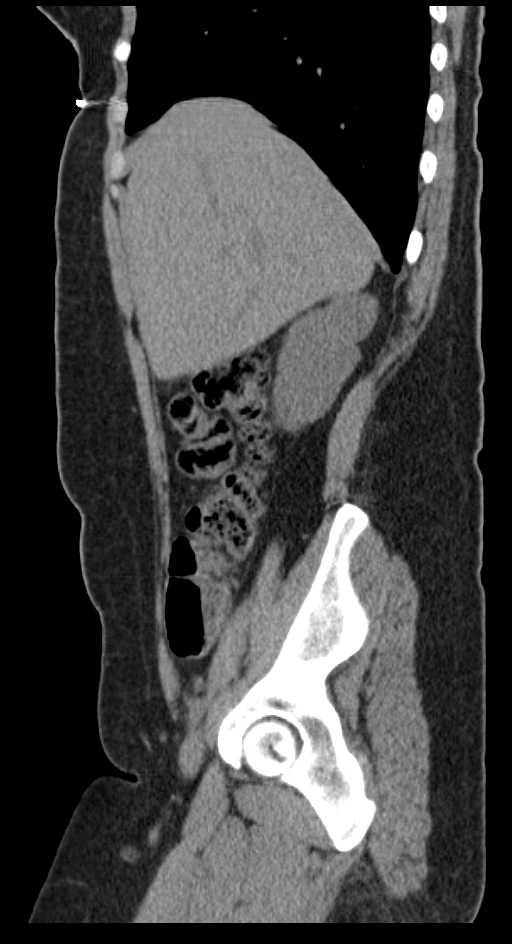

[10 of 46 positions shown; findings below may reference images not displayed]

FINDINGS: Lower chest and abdominal wall:  No contributory findings.

Hepatobiliary: No focal liver abnormality.No evidence of biliary
obstruction or stone.

Pancreas: Unremarkable.

Spleen: Unremarkable.

Adrenals/Urinary Tract: Negative adrenals. Two presumed cysts in the
interpolar left kidney both measuring 14 mm. 4 mm lower pole stone
on the right. Punctate stone on the left. No hydronephrosis or
ureteral calculus. Unremarkable bladder.

Stomach/Bowel:  No obstruction or inflammation. No appendicitis.

Reproductive:No pathologic findings.  Vaginal ring in place.

Vascular/Lymphatic: No acute vascular abnormality. No mass or
adenopathy.

Other: Trace pelvic fluid, usually physiologic.

Musculoskeletal: No acute abnormalities.
IMPRESSION: 1. No acute finding to explain abdominal pain.
2. Bilateral nonobstructive renal calculi.

## 2019-05-16 ENCOUNTER — Encounter: Payer: Self-pay | Admitting: *Deleted

## 2019-05-16 ENCOUNTER — Other Ambulatory Visit: Payer: Self-pay

## 2019-05-16 DIAGNOSIS — N939 Abnormal uterine and vaginal bleeding, unspecified: Secondary | ICD-10-CM | POA: Diagnosis present

## 2019-05-16 DIAGNOSIS — Z79899 Other long term (current) drug therapy: Secondary | ICD-10-CM | POA: Insufficient documentation

## 2019-05-16 DIAGNOSIS — N920 Excessive and frequent menstruation with regular cycle: Secondary | ICD-10-CM | POA: Insufficient documentation

## 2019-05-16 LAB — CBC WITH DIFFERENTIAL/PLATELET
Abs Immature Granulocytes: 0.01 10*3/uL (ref 0.00–0.07)
Basophils Absolute: 0 10*3/uL (ref 0.0–0.1)
Basophils Relative: 0 %
Eosinophils Absolute: 0.3 10*3/uL (ref 0.0–0.5)
Eosinophils Relative: 4 %
HCT: 39.7 % (ref 36.0–46.0)
Hemoglobin: 13.6 g/dL (ref 12.0–15.0)
Immature Granulocytes: 0 %
Lymphocytes Relative: 31 %
Lymphs Abs: 2.2 10*3/uL (ref 0.7–4.0)
MCH: 31.8 pg (ref 26.0–34.0)
MCHC: 34.3 g/dL (ref 30.0–36.0)
MCV: 92.8 fL (ref 80.0–100.0)
Monocytes Absolute: 0.8 10*3/uL (ref 0.1–1.0)
Monocytes Relative: 11 %
Neutro Abs: 3.8 10*3/uL (ref 1.7–7.7)
Neutrophils Relative %: 54 %
Platelets: 188 10*3/uL (ref 150–400)
RBC: 4.28 MIL/uL (ref 3.87–5.11)
RDW: 12.2 % (ref 11.5–15.5)
WBC: 7.1 10*3/uL (ref 4.0–10.5)
nRBC: 0 % (ref 0.0–0.2)

## 2019-05-16 LAB — URINALYSIS, COMPLETE (UACMP) WITH MICROSCOPIC
Bacteria, UA: NONE SEEN
Bilirubin Urine: NEGATIVE
Glucose, UA: NEGATIVE mg/dL
Ketones, ur: NEGATIVE mg/dL
Leukocytes,Ua: NEGATIVE
Nitrite: NEGATIVE
Protein, ur: NEGATIVE mg/dL
RBC / HPF: >50 RBC/hpf — ABNORMAL HIGH (ref 0–5)
Specific Gravity, Urine: 1.016 (ref 1.005–1.030)
pH: 6 (ref 5.0–8.0)

## 2019-05-16 LAB — BASIC METABOLIC PANEL
Anion gap: 9 (ref 5–15)
BUN: 11 mg/dL (ref 6–20)
CO2: 28 mmol/L (ref 22–32)
Calcium: 8.9 mg/dL (ref 8.9–10.3)
Chloride: 103 mmol/L (ref 98–111)
Creatinine, Ser: 0.72 mg/dL (ref 0.44–1.00)
GFR calc Af Amer: >60 mL/min (ref >60)
GFR calc non Af Amer: >60 mL/min (ref >60)
Glucose, Bld: 116 mg/dL — ABNORMAL HIGH (ref 70–99)
Potassium: 4.4 mmol/L (ref 3.5–5.1)
Sodium: 140 mmol/L (ref 135–145)

## 2019-05-16 LAB — POCT PREGNANCY, URINE: Preg Test, Ur: NEGATIVE

## 2019-05-16 NOTE — ED Triage Notes (Signed)
Pt reports menstrual pain with heavy vag bleeding for 1 day.  Pt taking otc meds without relief.  States passing large clots of blood.    No urinary sx.  Pt alert  Speech clear.

## 2019-05-16 NOTE — ED Notes (Signed)
poct pregnancy Negative 

## 2019-05-17 ENCOUNTER — Emergency Department
Admission: EM | Admit: 2019-05-17 | Discharge: 2019-05-17 | Disposition: A | Discharge status: Home / Self Care | Payer: BC Managed Care – PPO | Attending: Emergency Medicine | Admitting: Emergency Medicine

## 2019-05-17 ENCOUNTER — Emergency Department: Payer: BC Managed Care – PPO

## 2019-05-17 DIAGNOSIS — N939 Abnormal uterine and vaginal bleeding, unspecified: Secondary | ICD-10-CM

## 2019-05-17 DIAGNOSIS — N920 Excessive and frequent menstruation with regular cycle: Secondary | ICD-10-CM

## 2019-05-17 MED ORDER — TRAMADOL HCL 50 MG PO TABS: 50.0000 mg | ORAL_TABLET | Freq: Four times a day (QID) | ORAL | 0 refills | Status: AC | PRN

## 2019-05-17 MED ORDER — TRAMADOL HCL 50 MG PO TABS
50.0000 mg | ORAL_TABLET | Freq: Once | ORAL | Status: AC
  Administered 2019-05-17: 50 mg via ORAL
  Filled 2019-05-17: qty 1

## 2019-05-17 NOTE — ED Notes (Signed)
Pt reports discontinuing vaginal ring birth control approx 1 mo ago

## 2019-05-17 NOTE — ED Provider Notes (Signed)
[facility] Emergency Department Provider Note  ____________________________________________   First MD Initiated Contact with Patient 05/17/19 [phone]     (approximate)  I have reviewed the triage vital signs and the nursing notes.   HISTORY  Chief Complaint Vaginal Bleeding    HPI Judy Kim is a 34 y.o. female with below list of previous medical conditions including recently removed NuvaRing presents emergency department secondary to a 1 day history of heavy vaginal bleeding with associated menstrual pain.  Patient states that she has taken over-the-counter medication without any improvement of her discomfort including ibuprofen.  Patient denies any fever afebrile on presentation.  Patient has no dizziness no nausea or vomiting.  Patient does state bleeding has decreased over the past hour or 2 however before that patient states that she was using a super absorbent tampon every hour. current pain score 5 out of 10.       Past Medical History:  Diagnosis Date  . Kidney stones   . Ovarian cyst     There are no problems to display for this patient.   No past surgical history on file.  Prior to Admission medications   Medication Sig Start Date End Date Taking? Authorizing Provider  azithromycin (ZITHROMAX) 250 MG tablet Take 1 tablet (250 mg total) by mouth daily. Take first 2 tablets together, then 1 every day until finished. 10/27/15   Isa Rankin, MD  benzonatate (TESSALON) 200 MG capsule Take 1 capsule (200 mg total) by mouth every 8 (eight) hours. 10/23/15   Lutricia Feil, PA-C  chlorpheniramine-HYDROcodone (TUSSIONEX PENNKINETIC ER) 10-8 MG/5ML SUER Take 5 mLs by mouth 2 (two) times daily. 10/23/15   Lutricia Feil, PA-C  etonogestrel-ethinyl estradiol (NUVARING) 0.12-0.015 MG/24HR vaginal ring Place 1 each vaginally every 28 (twenty-eight) days. Insert vaginally and leave in place for 3 consecutive weeks, then remove for 1 week.     [provider]  traMADol (ULTRAM) 50 MG tablet Take 1 tablet (50 mg total) by mouth every 6 (six) hours as needed. 05/17/19 05/16/20  Darci Current, MD    Allergies Keflex [cephalexin]  No family history on file.  Social History Social History   Tobacco Use  . Smoking status: Never Smoker  . Smokeless tobacco: Never Used  Substance Use Topics  . Alcohol use: Yes    Comment: social  . Drug use: No    Review of Systems Constitutional: No fever/chills Eyes: No visual changes. ENT: No sore throat. Cardiovascular: Denies chest pain. Respiratory: Denies shortness of breath. Gastrointestinal: No abdominal pain.  No nausea, no vomiting.  No diarrhea.  No constipation. Genitourinary: Negative for dysuria.  Positive for pelvic discomfort.  Positive for heavy vaginal bleeding Musculoskeletal: Negative for neck pain.  Negative for back pain. Integumentary: Negative for rash. Neurological: Negative for headaches, focal weakness or numbness.   ____________________________________________   PHYSICAL EXAM:  VITAL SIGNS: ED Triage Vitals  Enc Vitals Group     BP 05/16/19 2249 124/84     Pulse Rate 05/16/19 2249 83     Resp 05/16/19 2249 18     Temp 05/16/19 2249 98.4 F (36.9 C)     Temp Source 05/16/19 2249 Oral     SpO2 05/16/19 2249 100 %     Weight 05/16/19 2250 62.6 kg (138 lb)     Height 05/16/19 2250 1.499 m (4\' 11" )     Head Circumference --      Peak Flow --  Pain Score 05/16/19 2250 6     Pain Loc --      Pain Edu? --      Excl. in GC? --     Constitutional: Alert and oriented.  Eyes: Conjunctivae are normal.  Mouth/Throat: Patient is wearing a mask. Neck: No stridor.  No meningeal signs.   Cardiovascular: Normal rate, regular rhythm. Good peripheral circulation. Grossly normal heart sounds. Respiratory: Normal respiratory effort.  No retractions. Gastrointestinal: Soft and nontender. No distention.  Musculoskeletal: No lower extremity  tenderness nor edema. No gross deformities of extremities. Neurologic:  Normal speech and language. No gross focal neurologic deficits are appreciated.  Skin:  Skin is warm, dry and intact. Psychiatric: Mood and affect are normal. Speech and behavior are normal.  ____________________________________________   LABS (all labs ordered are listed, but only abnormal results are displayed)  Labs Reviewed  BASIC METABOLIC PANEL - Abnormal; Notable for the following components:      Result Value   Glucose, Bld 116 (*)    All other components within normal limits  URINALYSIS, COMPLETE (UACMP) WITH MICROSCOPIC - Abnormal; Notable for the following components:   Color, Urine YELLOW (*)    APPearance CLEAR (*)    Hgb urine dipstick LARGE (*)    RBC / HPF >50 (*)    All other components within normal limits  CBC WITH DIFFERENTIAL/PLATELET  POC URINE PREG, ED  POCT PREGNANCY, URINE    RADIOLOGY I, [place] Janzen Sacks, personally viewed and evaluated these images (plain radiographs) as part of my medical decision making, as well as reviewing the written report by the radiologist.  ED MD interpretation: Negative pelvic ultrasound per radiologist.  Official radiology report(s): US PELVIC COMPLETE WITH TRANSVAGINAL  Result Date: 05/17/2019 CLINICAL DATA:  Heavy vaginal bleeding x1 day EXAM: TRANSABDOMINAL AND TRANSVAGINAL ULTRASOUND OF PELVIS TECHNIQUE: Both transabdominal and transvaginal ultrasound examinations of the pelvis were performed. Transabdominal technique was performed for global imaging of the pelvis including uterus, ovaries, adnexal regions, and pelvic cul-de-sac. It was necessary to proceed with endovaginal exam following the transabdominal exam to visualize the left ovary. COMPARISON:  09/02/2015 FINDINGS: Uterus Measurements: 5.9 x 2.9 x 3.9 cm = volume: 33 mL. No fibroids or other mass visualized. Endometrium Thickness: 7 mm.  No focal abnormality visualized. Right ovary Measurements:  3.5 x 2.0 x 1.9 cm = volume: 6.9 mL. Normal appearance/no adnexal mass. Left ovary Measurements: 3.0 x 2.2 x 3.2 cm = volume: 11.0 mL. Normal appearance/no adnexal mass. Other findings No abnormal free fluid. IMPRESSION: Negative pelvic ultrasound. Electronically Signed   By: Charline Bills M.D.   On: 05/17/2019 01:53     Procedures   ____________________________________________   INITIAL IMPRESSION / MDM / ASSESSMENT AND PLAN / ED COURSE  As part of my medical decision making, I reviewed the following data within the electronic MEDICAL RECORD NUMBER   34 year old female presented with above-stated history and physical exam secondary to menstrual pain.  Patient given tramadol in the emergency department will prescribe same for home.  Laboratory data unremarkable including H&H  ____________________________________________  FINAL CLINICAL IMPRESSION(S) / ED DIAGNOSES  Final diagnoses:  Abnormal vaginal bleeding  Menorrhagia with regular cycle     MEDICATIONS GIVEN DURING THIS VISIT:  Medications  traMADol (ULTRAM) tablet 50 mg (has no administration in time range)     ED Discharge Orders         Ordered    traMADol (ULTRAM) 50 MG tablet  Every 6 hours PRN  05/17/19 0236          *Please note:  HUMAIRA SCULLEY was evaluated in Emergency Department on 05/17/2019 for the symptoms described in the history of present illness. She was evaluated in the context of the global COVID-19 pandemic, which necessitated consideration that the patient might be at risk for infection with the SARS-CoV-2 virus that causes COVID-19. Institutional protocols and algorithms that pertain to the evaluation of patients at risk for COVID-19 are in a state of rapid change based on information released by regulatory bodies including the CDC and federal and state organizations. These policies and algorithms were followed during the patient's care in the ED.  Some ED evaluations and interventions may be  delayed as a result of limited staffing during the pandemic.*  Note:  This document was prepared using Dragon voice recognition software and may include unintentional dictation errors.   Gregor Hams, MD 05/17/19 [phone]

## 2019-05-17 NOTE — ED Notes (Signed)
Pt reports taking advil throughout yesterday, as well as taking one oxycodone, which was 88-34 yrs old.  Pt reports last dose advil approx 1400 yesterday Pt reports expired oxycodone at approx 1900

## 2019-05-17 NOTE — ED Notes (Signed)
Pt reports pain radiates into right side of lumbar area  Pt reports previous menstruation approx Feb 28, which was normal

## 2022-02-10 IMAGING — US US PELVIS COMPLETE WITH TRANSVAGINAL
1 series · 14 of 25 positions shown · non-contrast
Comparison: 09/02/2015

CLINICAL DATA: Heavy vaginal bleeding x1 day

EXAM:
TRANSABDOMINAL AND TRANSVAGINAL ULTRASOUND OF PELVIS
TECHNIQUE: Both transabdominal and transvaginal ultrasound examinations of the
pelvis were performed. Transabdominal technique was performed for
global imaging of the pelvis including uterus, ovaries, adnexal
regions, and pelvic cul-de-sac. It was necessary to proceed with
endovaginal exam following the transabdominal exam to visualize the
left ovary.

[Series 1: us pelvic complete with transvaginal · 14 of 104 slices shown]
[im 1/104]
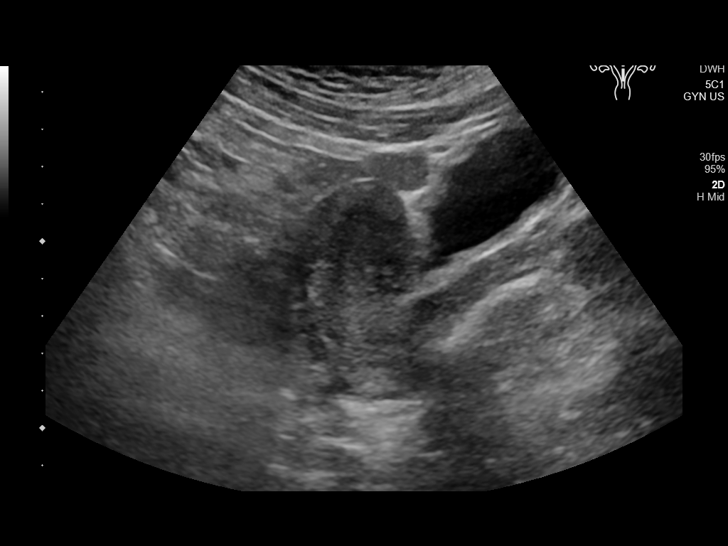
[im 9/104]
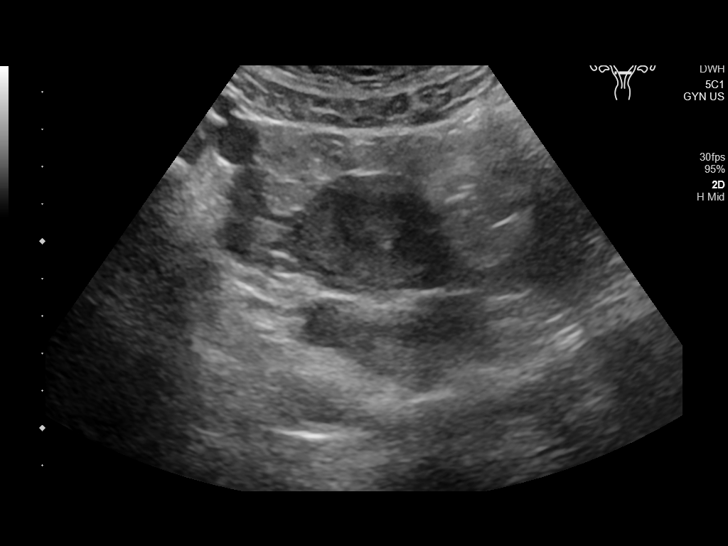
[im 18/104]
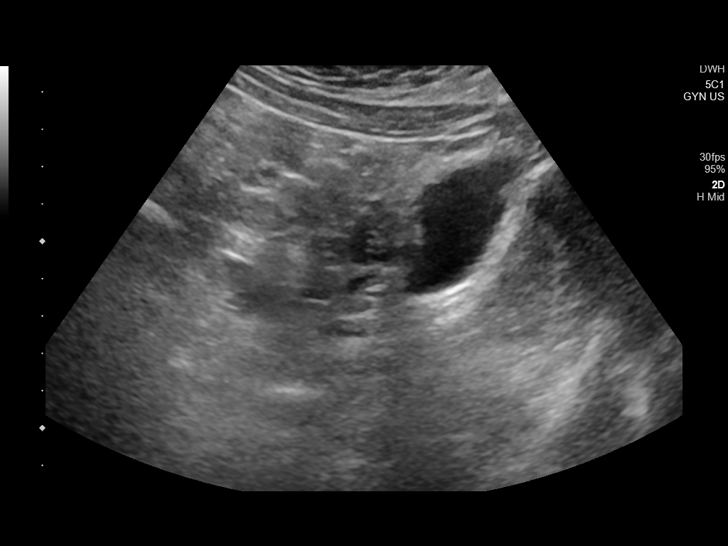
[im 26/104]
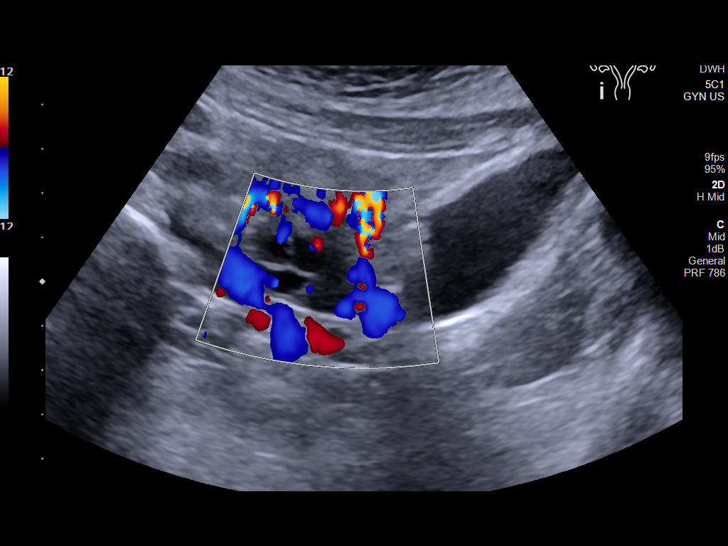
[im 35/104]
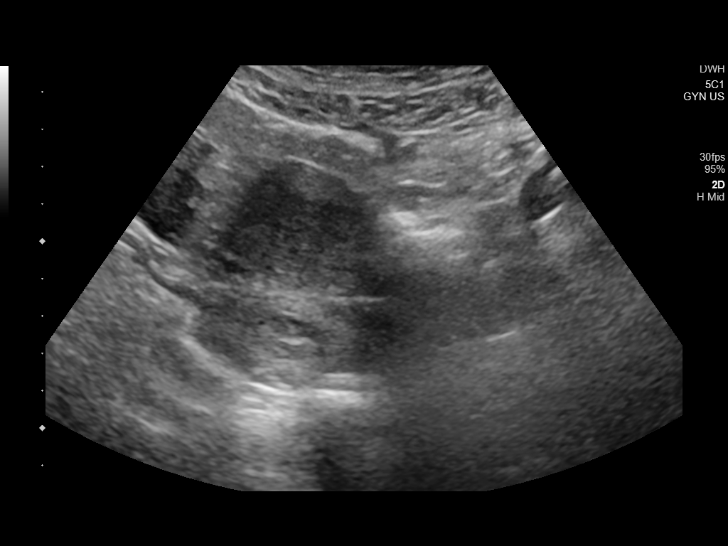
[im 39/104]
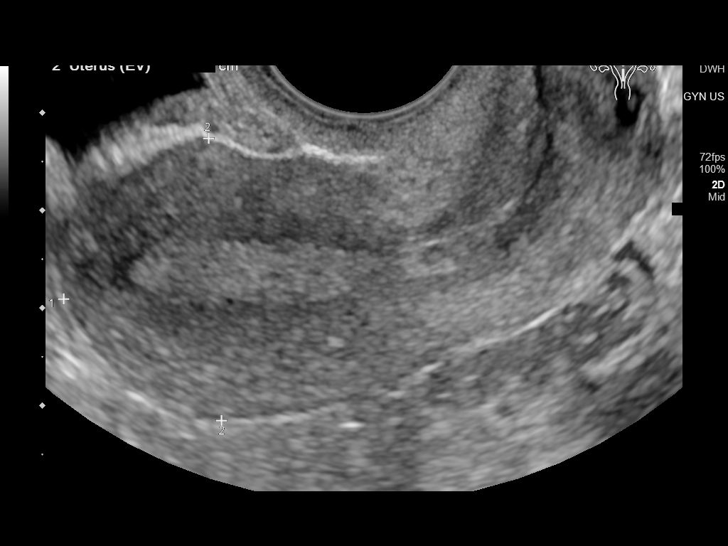
[im 48/104]
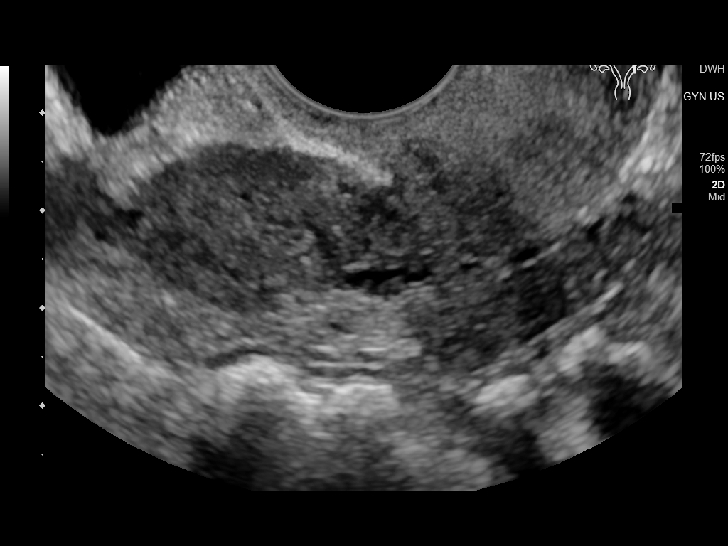
[im 56/104]
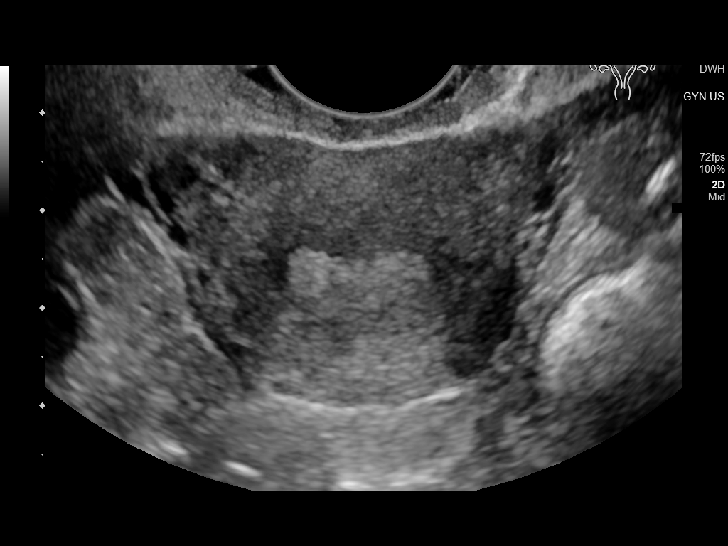
[im 65/104]
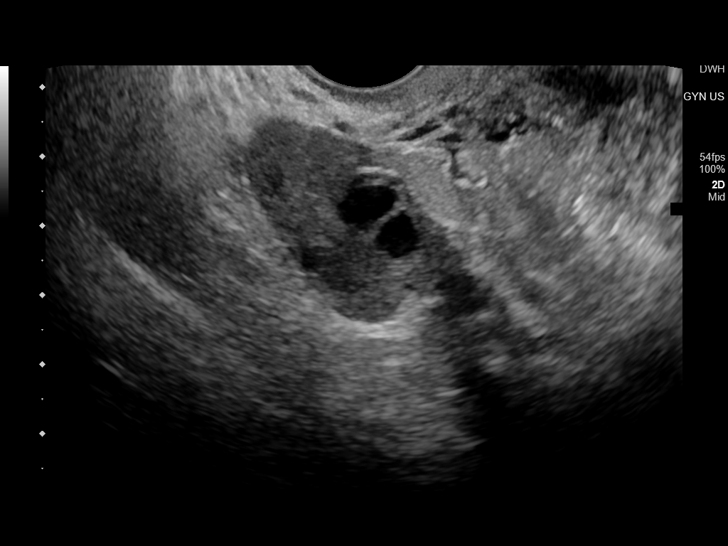
[im 69/104]
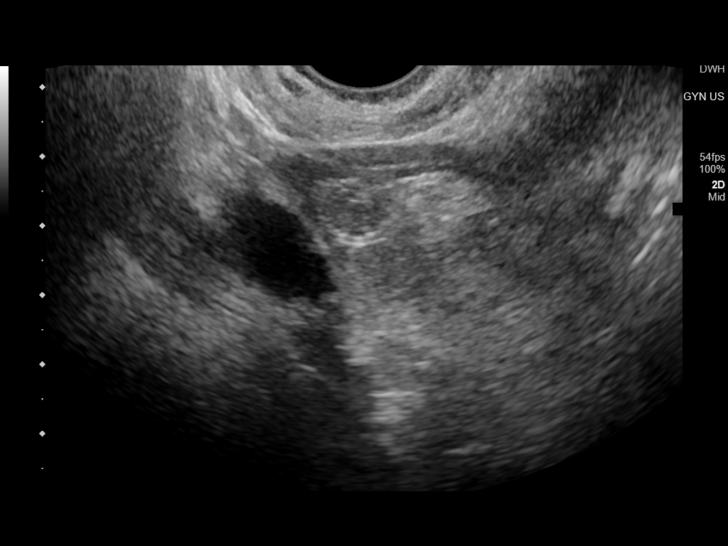
[im 78/104]
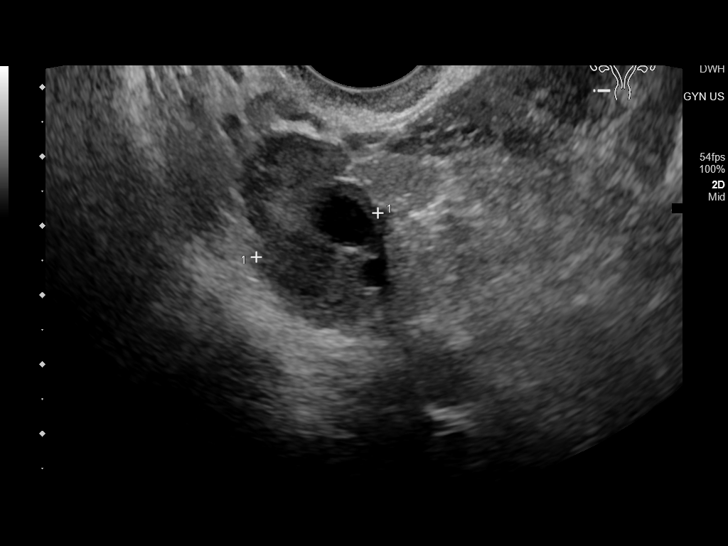
[im 86/104]
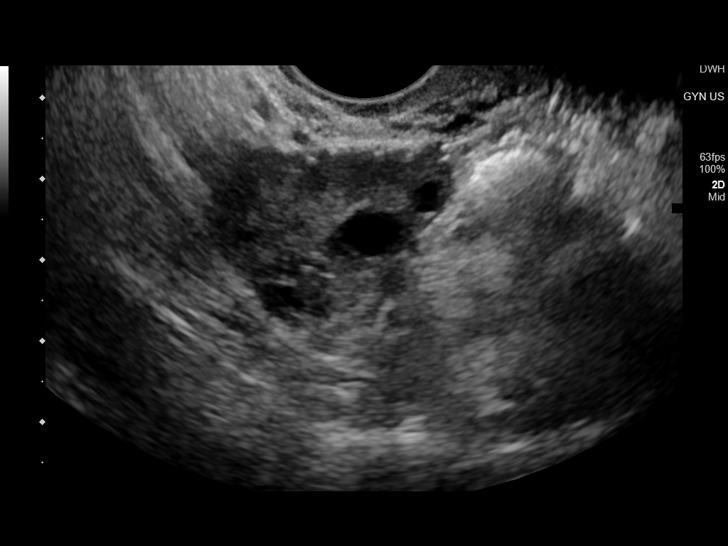
[im 95/104]
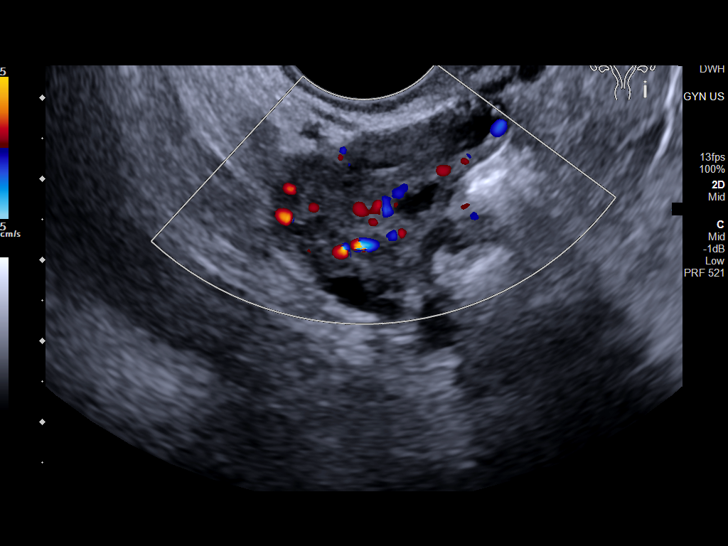
[im 104/104]
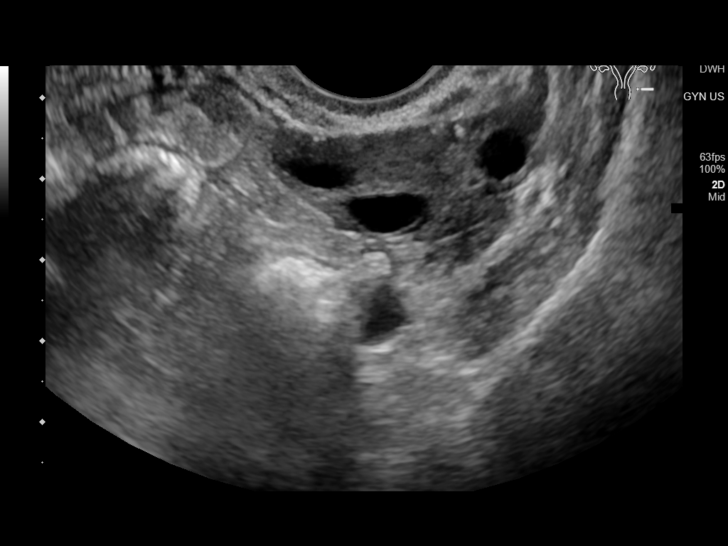

[14 of 25 positions shown; findings below may reference images not displayed]

FINDINGS: Uterus

Measurements: 5.9 x 2.9 x 3.9 cm = volume: 33 mL. No fibroids or
other mass visualized.

Endometrium

Thickness: 7 mm.  No focal abnormality visualized.

Right ovary

Measurements: 3.5 x 2.0 x 1.9 cm = volume: 6.9 mL. Normal
appearance/no adnexal mass.

Left ovary

Measurements: 3.0 x 2.2 x 3.2 cm = volume: 11.0 mL. Normal
appearance/no adnexal mass.

Other findings

No abnormal free fluid.
IMPRESSION: Negative pelvic ultrasound.
